# Patient Record
Sex: Male | Born: 2000 | Hispanic: No | Marital: Single | Smoking: Never smoker
Health system: Southern US, Community
[De-identification: ages and names within clinical notes are randomized; demographics above are authoritative.]

## PROBLEM LIST (undated history)

## (undated) DIAGNOSIS — G4700 Insomnia, unspecified: Secondary | ICD-10-CM

## (undated) DIAGNOSIS — G43909 Migraine, unspecified, not intractable, without status migrainosus: Secondary | ICD-10-CM

## (undated) DIAGNOSIS — E559 Vitamin D deficiency, unspecified: Secondary | ICD-10-CM

## (undated) DIAGNOSIS — F909 Attention-deficit hyperactivity disorder, unspecified type: Secondary | ICD-10-CM

## (undated) DIAGNOSIS — J45909 Unspecified asthma, uncomplicated: Secondary | ICD-10-CM

## (undated) DIAGNOSIS — F901 Attention-deficit hyperactivity disorder, predominantly hyperactive type: Secondary | ICD-10-CM

## (undated) DIAGNOSIS — R569 Unspecified convulsions: Secondary | ICD-10-CM

## (undated) DIAGNOSIS — M549 Dorsalgia, unspecified: Secondary | ICD-10-CM

## (undated) DIAGNOSIS — G40A09 Absence epileptic syndrome, not intractable, without status epilepticus: Secondary | ICD-10-CM

## (undated) DIAGNOSIS — K219 Gastro-esophageal reflux disease without esophagitis: Secondary | ICD-10-CM

## (undated) DIAGNOSIS — J309 Allergic rhinitis, unspecified: Secondary | ICD-10-CM

## (undated) DIAGNOSIS — G4730 Sleep apnea, unspecified: Secondary | ICD-10-CM

## (undated) DIAGNOSIS — T50905A Adverse effect of unspecified drugs, medicaments and biological substances, initial encounter: Secondary | ICD-10-CM

## (undated) HISTORY — PX: ADENOIDECTOMY: SUR15

## (undated) HISTORY — PX: OTHER SURGICAL HISTORY: SHX169

## (undated) HISTORY — PX: TONSILLECTOMY: SUR1361

## (undated) HISTORY — PX: TYMPANOSTOMY TUBE PLACEMENT: SHX32

---
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
## 2022-01-14 DIAGNOSIS — Z20828 Contact with and (suspected) exposure to other viral communicable diseases: Secondary | ICD-10-CM | POA: Diagnosis not present

## 2022-01-14 DIAGNOSIS — R030 Elevated blood-pressure reading, without diagnosis of hypertension: Secondary | ICD-10-CM | POA: Diagnosis not present

## 2022-01-14 DIAGNOSIS — H6691 Otitis media, unspecified, right ear: Secondary | ICD-10-CM | POA: Diagnosis not present

## 2022-01-14 DIAGNOSIS — J019 Acute sinusitis, unspecified: Secondary | ICD-10-CM | POA: Diagnosis not present

## 2022-01-17 DIAGNOSIS — J4520 Mild intermittent asthma, uncomplicated: Secondary | ICD-10-CM | POA: Diagnosis not present

## 2022-01-17 DIAGNOSIS — K219 Gastro-esophageal reflux disease without esophagitis: Secondary | ICD-10-CM | POA: Diagnosis not present

## 2022-01-17 DIAGNOSIS — R030 Elevated blood-pressure reading, without diagnosis of hypertension: Secondary | ICD-10-CM | POA: Diagnosis not present

## 2022-01-17 DIAGNOSIS — Z118 Encounter for screening for other infectious and parasitic diseases: Secondary | ICD-10-CM | POA: Diagnosis not present

## 2022-01-17 DIAGNOSIS — E559 Vitamin D deficiency, unspecified: Secondary | ICD-10-CM | POA: Diagnosis not present

## 2022-01-17 DIAGNOSIS — J309 Allergic rhinitis, unspecified: Secondary | ICD-10-CM | POA: Diagnosis not present

## 2022-01-17 DIAGNOSIS — F988 Other specified behavioral and emotional disorders with onset usually occurring in childhood and adolescence: Secondary | ICD-10-CM | POA: Diagnosis not present

## 2022-01-17 DIAGNOSIS — S61215A Laceration without foreign body of left ring finger without damage to nail, initial encounter: Secondary | ICD-10-CM | POA: Diagnosis not present

## 2022-01-17 DIAGNOSIS — Z68.41 Body mass index (BMI) pediatric, greater than or equal to 95th percentile for age: Secondary | ICD-10-CM | POA: Diagnosis not present

## 2022-01-17 DIAGNOSIS — Z0000 Encounter for general adult medical examination without abnormal findings: Secondary | ICD-10-CM | POA: Diagnosis not present

## 2022-01-24 DIAGNOSIS — Z68.41 Body mass index (BMI) pediatric, greater than or equal to 95th percentile for age: Secondary | ICD-10-CM | POA: Diagnosis not present

## 2022-01-24 DIAGNOSIS — J209 Acute bronchitis, unspecified: Secondary | ICD-10-CM | POA: Diagnosis not present

## 2022-04-18 DIAGNOSIS — R030 Elevated blood-pressure reading, without diagnosis of hypertension: Secondary | ICD-10-CM | POA: Diagnosis not present

## 2022-04-18 DIAGNOSIS — E559 Vitamin D deficiency, unspecified: Secondary | ICD-10-CM | POA: Diagnosis not present

## 2022-04-18 DIAGNOSIS — J4520 Mild intermittent asthma, uncomplicated: Secondary | ICD-10-CM | POA: Diagnosis not present

## 2022-04-18 DIAGNOSIS — F988 Other specified behavioral and emotional disorders with onset usually occurring in childhood and adolescence: Secondary | ICD-10-CM | POA: Diagnosis not present

## 2022-04-18 DIAGNOSIS — K219 Gastro-esophageal reflux disease without esophagitis: Secondary | ICD-10-CM | POA: Diagnosis not present

## 2022-04-18 DIAGNOSIS — J309 Allergic rhinitis, unspecified: Secondary | ICD-10-CM | POA: Diagnosis not present

## 2022-04-18 DIAGNOSIS — Z68.41 Body mass index (BMI) pediatric, greater than or equal to 95th percentile for age: Secondary | ICD-10-CM | POA: Diagnosis not present

## 2022-06-13 ENCOUNTER — Ambulatory Visit: Payer: BC Managed Care – PPO | Admitting: "Endocrinology

## 2022-06-13 ENCOUNTER — Telehealth: Payer: Self-pay | Admitting: "Endocrinology

## 2022-06-13 DIAGNOSIS — E049 Nontoxic goiter, unspecified: Secondary | ICD-10-CM

## 2022-06-13 NOTE — Telephone Encounter (Signed)
Labs were updated. 

## 2022-06-13 NOTE — Telephone Encounter (Signed)
Can you update labs 

## 2022-06-13 NOTE — Telephone Encounter (Signed)
Labs were updated.

## 2022-07-08 IMAGING — DX DG FINGER LITTLE 2+V*R*
3 series · 3 of 3 positions shown · non-contrast
Comparison: None.

CLINICAL DATA: Recent trip and fall with fifth digit pain, initial
encounter

EXAM:
RIGHT LITTLE FINGER 2+V

[finger ap]
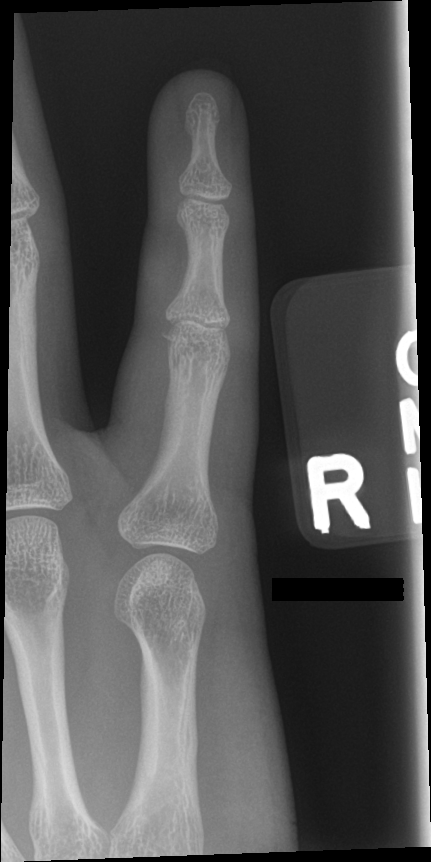

[finger obl]
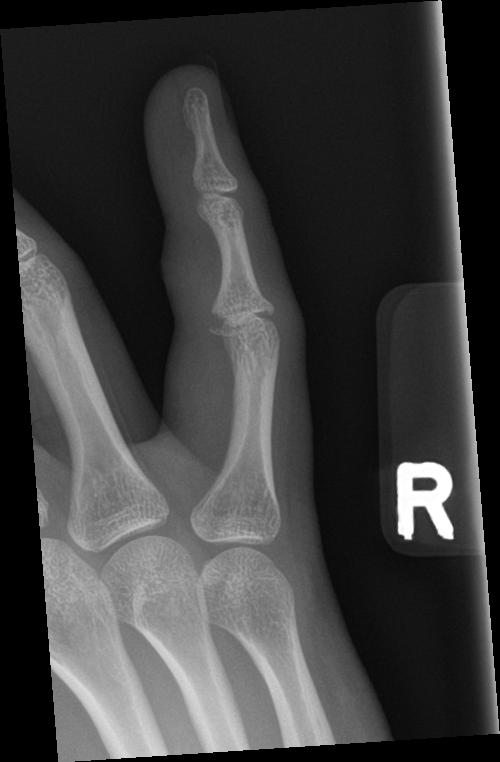

[finger lat]
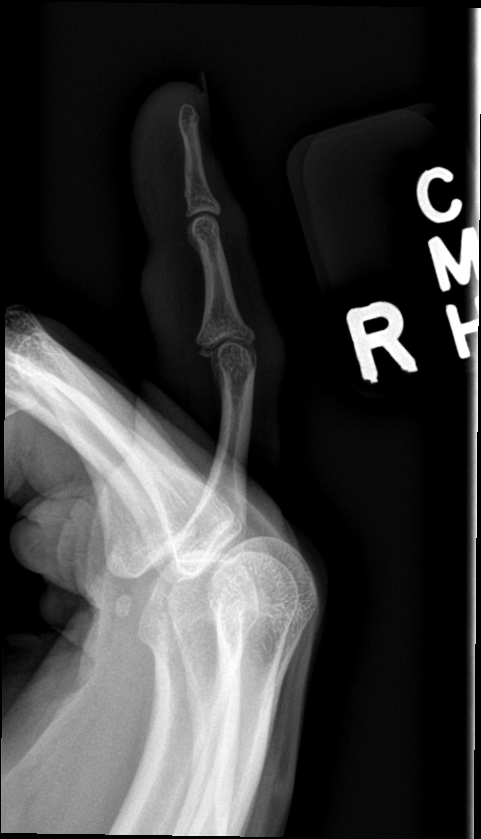

[3 of 3 positions shown; findings below may reference images not displayed]

FINDINGS: There is a fracture at the lateral base of the fifth middle phalanx
with mild displacement of the fracture fragment. No other fracture
is seen. Mild soft tissue swelling is noted.
IMPRESSION: Fracture at the base of the fifth middle phalanx.

## 2022-11-26 ENCOUNTER — Encounter (INDEPENDENT_AMBULATORY_CARE_PROVIDER_SITE_OTHER): Payer: Self-pay

## 2022-11-26 ENCOUNTER — Ambulatory Visit (INDEPENDENT_AMBULATORY_CARE_PROVIDER_SITE_OTHER): Payer: BC Managed Care – PPO | Admitting: Otolaryngology

## 2022-11-26 ENCOUNTER — Encounter (INDEPENDENT_AMBULATORY_CARE_PROVIDER_SITE_OTHER): Payer: Self-pay | Admitting: Otolaryngology

## 2022-11-26 VITALS — Ht 69.0 in | Wt 225.0 lb

## 2022-11-26 DIAGNOSIS — R040 Epistaxis: Secondary | ICD-10-CM

## 2022-11-26 DIAGNOSIS — J342 Deviated nasal septum: Secondary | ICD-10-CM

## 2022-11-26 DIAGNOSIS — J343 Hypertrophy of nasal turbinates: Secondary | ICD-10-CM | POA: Diagnosis not present

## 2022-11-26 DIAGNOSIS — J310 Chronic rhinitis: Secondary | ICD-10-CM | POA: Diagnosis not present

## 2022-11-28 DIAGNOSIS — J343 Hypertrophy of nasal turbinates: Secondary | ICD-10-CM | POA: Insufficient documentation

## 2022-11-28 DIAGNOSIS — R040 Epistaxis: Secondary | ICD-10-CM | POA: Insufficient documentation

## 2022-11-28 DIAGNOSIS — J342 Deviated nasal septum: Secondary | ICD-10-CM | POA: Insufficient documentation

## 2022-11-28 DIAGNOSIS — J310 Chronic rhinitis: Secondary | ICD-10-CM | POA: Insufficient documentation

## 2022-11-28 NOTE — Progress Notes (Unsigned)
Patient ID: Wesley Holland, male   DOB: 2000/06/29, 22 y.o.   MRN: [id number]  Follow-up: Chronic nasal congestion, nasal septal deviation New complaint: Recurrent epistaxis  HPI: The patient is a 22 year old male who presents today with a new complaint of recurrent right epistaxis.  The patient was previously seen for chronic nasal congestion.  He was previously noted to have nasal mucosal congestion, nasal septal deviation, and bilateral inferior turbinate hypertrophy.  The patient was treated with Flonase nasal spray.  The patient returns today complaining of occasional nasal congestion.  He has stopped the use of Flonase due to his recurrent epistaxis.  5 days ago, he had an episode of severe right epistaxis.  He was seen at the emergency room, and his right nasal cavity was packed with a Rhino Rocket packing.  The patient has a history of recurrent epistaxis.  He was previously cauterized on multiple occasions.  He is not on any anticoagulation medication.  He denies any recent nasal trauma.  Exam: General: Communicates without difficulty, well nourished, no acute distress. Head: Normocephalic, no evidence injury, no tenderness, facial buttresses intact without stepoff. Face/sinus: No tenderness to palpation and percussion. Facial movement is normal and symmetric. Eyes: PERRL, EOMI. No scleral icterus, conjunctivae clear. Neuro: CN II exam reveals vision grossly intact.  No nystagmus at any point of gaze. Ears: Auricles well formed without lesions.  Ear canals are intact without mass or lesion.  No erythema or edema is appreciated.  The TMs are intact without fluid. Nose: External evaluation reveals normal support and skin without lesions.  Dorsum is intact.  The right nasal packing is removed.  Hypervascular areas are noted on the right anterior nasal septum.  Deviated nasal septum is noted.  Oral:  Oral cavity and oropharynx are intact, symmetric, without erythema or edema.  Mucosa is moist without  lesions. Neck: Full range of motion without pain.  There is no significant lymphadenopathy.  No masses palpable.  Thyroid bed within normal limits to palpation.  Parotid glands and submandibular glands equal bilaterally without mass.  Trachea is midline. Neuro:  CN 2-12 grossly intact.    Procedure:  Cauterization of the right anterior nasal septum.   Indication: To control the recurrent epistaxis.   Description:  The nasal septum is sprayed with topical xylocaine and neo-synephrine. After adequate anesthesia is achieved, the anterior nasal septum is extensively cauterized with silver nitrate. Bleeding is noted and controlled. Multiple passes are made. Topical ointment is applied to the nasal septum. The patient tolerated the procedure well without difficulty.   Assessment: 1.  Recurrent right epistaxis.  Hypervascular areas are noted on the right anterior nasal septum. 2.  Chronic rhinitis with nasal mucosal congestion, nasal septal deviation, and bilateral inferior turbinate hypertrophy.  Plan: 1.  The physical exam findings are reviewed with the patient. 2.  Extensive cauterization of the right anterior nasal septum. 3.  The patient may use Flonase as needed to treat his chronic nasal congestion.  The proper technique of using the nasal spray is demonstrated to the patient. 4.  The patient will return for reevaluation in 1 month. 5.  The option of septoplasty and turbinate reduction to treat his chronic nasal obstruction is also discussed.

## 2022-12-29 ENCOUNTER — Encounter (INDEPENDENT_AMBULATORY_CARE_PROVIDER_SITE_OTHER): Payer: Self-pay | Admitting: Otolaryngology

## 2022-12-29 ENCOUNTER — Ambulatory Visit (INDEPENDENT_AMBULATORY_CARE_PROVIDER_SITE_OTHER): Payer: BC Managed Care – PPO | Admitting: Otolaryngology

## 2022-12-29 ENCOUNTER — Encounter (INDEPENDENT_AMBULATORY_CARE_PROVIDER_SITE_OTHER): Payer: Self-pay

## 2022-12-29 VITALS — Ht 69.0 in | Wt 225.0 lb

## 2022-12-29 DIAGNOSIS — J343 Hypertrophy of nasal turbinates: Secondary | ICD-10-CM

## 2022-12-29 DIAGNOSIS — J342 Deviated nasal septum: Secondary | ICD-10-CM | POA: Diagnosis not present

## 2022-12-29 DIAGNOSIS — R0981 Nasal congestion: Secondary | ICD-10-CM | POA: Diagnosis not present

## 2022-12-29 DIAGNOSIS — R040 Epistaxis: Secondary | ICD-10-CM

## 2022-12-29 DIAGNOSIS — J310 Chronic rhinitis: Secondary | ICD-10-CM | POA: Diagnosis not present

## 2022-12-30 NOTE — Progress Notes (Signed)
Patient ID: Wesley Holland, male   DOB: 12-05-2000, 22 y.o.   MRN: [id number]  Follow-up: Chronic nasal obstruction, recurrent epistaxis  HPI: The patient is a 22 year old male who returns today for his follow-up evaluation.  The patient has a history of chronic nasal obstruction.  He was previously noted to have nasal mucosal congestion, nasal septal deviation, and bilateral inferior turbinate hypertrophy.  He was treated with Flonase nasal spray daily.  At his last visit 1 month ago, he was also noted to have recurrent right epistaxis.  Hypervascular areas were noted on the right anterior nasal septum.  He underwent cauterization of the right nasal septum.  The patient returns today reporting no more nasal bleeding.  However, he continues to have significant difficulty breathing through his nose.  He has not noted any improvement in his breathing with the medical treatment.  He has been using steroid nasal spray for the past 3 years.  Exam: General: Communicates without difficulty, well nourished, no acute distress. Head: Normocephalic, no evidence injury, no tenderness, facial buttresses intact without stepoff. Face/sinus: No tenderness to palpation and percussion. Facial movement is normal and symmetric. Eyes: PERRL, EOMI. No scleral icterus, conjunctivae clear. Neuro: CN II exam reveals vision grossly intact.  No nystagmus at any point of gaze. Ears: Auricles well formed without lesions.  Ear canals are intact without mass or lesion.  No erythema or edema is appreciated.  The TMs are intact without fluid. Nose: External evaluation reveals normal support and skin without lesions.  Dorsum is intact.  Anterior rhinoscopy reveals congested mucosa over anterior aspect of inferior turbinates and deviated septum.  No purulence noted. Oral:  Oral cavity and oropharynx are intact, symmetric, without erythema or edema.  Mucosa is moist without lesions. Neck: Full range of motion without pain.  There is no significant  lymphadenopathy.  No masses palpable.  Thyroid bed within normal limits to palpation.  Parotid glands and submandibular glands equal bilaterally without mass.  Trachea is midline. Neuro:  CN 2-12 grossly intact.    Assessment: 1.  Chronic rhinitis with nasal mucosal congestion, nasal septal deviation, and bilateral inferior turbinate hypertrophy.  More than 95% of his nasal passageways are obstructed bilaterally.  He has not responded to medical treatment for the past 3+ years. 2.  No more recurrent epistaxis, status post cauterization of the right nasal septum.  Plan: 1.  The physical exam findings are reviewed with the patient. 2.  Continue with Flonase nasal spray 2 sprays each nostril daily. 3.  In light of his persistent symptoms, he may benefit from surgical intervention with septoplasty and bilateral turbinate reduction.  The risk, benefits, alternatives, and details of the procedures are extensively discussed.  Questions are invited and answered. 4.  The patient would like to proceed with the procedures.

## 2023-07-09 ENCOUNTER — Telehealth: Payer: Self-pay | Admitting: "Endocrinology

## 2023-07-09 ENCOUNTER — Other Ambulatory Visit: Payer: Self-pay | Admitting: *Deleted

## 2023-07-09 DIAGNOSIS — E049 Nontoxic goiter, unspecified: Secondary | ICD-10-CM

## 2023-07-09 NOTE — Telephone Encounter (Signed)
 Patient labs have been updated.

## 2023-07-09 NOTE — Telephone Encounter (Signed)
 Can you update pt's labs? His pcp sent over his Ultrasound and they want him to be seen.   Thank you

## 2023-07-18 LAB — THYROGLOBULIN ANTIBODY: Thyroglobulin Antibody: <1 [IU]/mL (ref 0.0–0.9)

## 2023-07-18 LAB — T4, FREE: Free T4: 1 ng/dL (ref 0.82–1.77)

## 2023-07-18 LAB — THYROID PEROXIDASE ANTIBODY: Thyroperoxidase Ab SerPl-aCnc: 16 [IU]/mL (ref 0–34)

## 2023-07-18 LAB — TSH: TSH: 1.5 u[IU]/mL (ref 0.450–4.500)

## 2023-07-23 ENCOUNTER — Ambulatory Visit: Admitting: "Endocrinology

## 2023-07-23 ENCOUNTER — Encounter: Payer: Self-pay | Admitting: "Endocrinology

## 2023-07-23 VITALS — BP 104/68 | HR 68 | Ht 69.0 in | Wt 231.2 lb

## 2023-07-23 DIAGNOSIS — E049 Nontoxic goiter, unspecified: Secondary | ICD-10-CM

## 2023-07-23 NOTE — Progress Notes (Signed)
 Endocrinology follow-up note                                             07/23/2023, 3:13 PM   Subjective:    Patient ID: Wesley Holland, male    DOB: Jul 15, 2000, PCP Wesley Cure, PA-C   Past Medical History:  Diagnosis Date   ADHD (attention deficit hyperactivity disorder), predominantly hyperactive impulsive type 10/01/2018   Adverse drug interaction with prescription medication 06/02/2011   Hospitalized due to interaction between Zarontin and Morphine given in ED for sedation for laceration repair   Allergic rhinitis due to allergen 06/05/2008   Asthma 2007   Back pain 10/07/2017   Episodic tension-type headache, not intractable 07/25/2016   Gastroesophageal Reflux 12/30/2013   Gastroesophageal reflux disease 12/30/2013   Insomnia 05/22/2017   Migraine 10/30/2014   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   Seizures (HCC)    febrile   Sleep apnea 12/28/2006   [facility]   Vitamin D  deficiency 08/23/2018   Past Surgical History:  Procedure Laterality Date   TONSILLECTOMY AND ADENOIDECTOMY Bilateral 2007   TYMPANOSTOMY TUBE PLACEMENT  2005   WISDOM TOOTH EXTRACTION  09/06/2019   Social History   Socioeconomic History   Marital status: Single    Spouse name: Not on file   Number of children: Not on file   Years of education: Not on file   Highest education level: Not on file  Occupational History   Not on file  Tobacco Use   Smoking status: Never    Passive exposure: Yes   Smokeless tobacco: Never  Vaping Use   Vaping status: Never Used  Substance and Sexual Activity   Alcohol use: Never   Drug use: Never   Sexual activity: Never  Other Topics Concern   Not on file  Social History Narrative   Wesley Holland is a 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 He lives with his mother. He enjoys video games, YouTube, and being with his friends. He does not like playing outside in the summer because it induces  migraines.   Social Drivers of Corporate investment banker Strain: Not on file  Food Insecurity: Not on file  Transportation Needs: Not on file  Physical Activity: Not on file  Stress: Not on file  Social Connections: Not on file   Family History  Problem Relation Age of Onset   Cancer Mother    Hypertension Mother    Diabetes Mother    Hyperlipidemia Mother    Outpatient Encounter Medications as of 07/23/2023  Medication Sig   montelukast  (SINGULAIR ) 10 MG tablet Take 1 tablet (10 mg total) by mouth daily.   albuterol  (VENTOLIN  HFA) 108 (90 Base) MCG/ACT inhaler Inhale 2 puffs into the lungs every 4 (four) hours as needed for wheezing or shortness of breath.   cetirizine  (ZYRTEC ) 10 MG tablet Take 1 tablet (10 mg total) by mouth daily.   cloNIDine  (CATAPRES ) 0.1 MG tablet Take 1 tablet (0.1 mg total) by mouth at bedtime as needed (insomnia). (Patient not taking: Reported on 07/12/2021)   fluticasone  (FLONASE ) 50 MCG/ACT nasal spray Place 1 spray into both nostrils daily.   lansoprazole  (PREVACID ) 15 MG capsule Take 1 capsule (15 mg total) by mouth daily at 12 noon.   lisdexamfetamine (VYVANSE ) 20 MG  capsule Take 1 capsule (20 mg total) by mouth daily in the afternoon.   lisdexamfetamine (VYVANSE ) 20 MG capsule Take 1 capsule (20 mg total) by mouth daily in the afternoon.   lisdexamfetamine (VYVANSE ) 20 MG capsule Take 1 capsule (20 mg total) by mouth daily in the afternoon.   lisdexamfetamine (VYVANSE ) 20 MG capsule Take 1 capsule (20 mg total) by mouth daily in the afternoon.   lisdexamfetamine (VYVANSE ) 40 MG capsule Take 1 capsule (40 mg total) by mouth every morning.   lisdexamfetamine (VYVANSE ) 40 MG capsule Take 1 capsule (40 mg total) by mouth every morning.   lisdexamfetamine (VYVANSE ) 40 MG capsule Take 1 capsule (40 mg total) by mouth every morning.   lisdexamfetamine (VYVANSE ) 40 MG capsule Take 1 capsule (40 mg total) by mouth every morning.   polyethylene glycol powder  (GLYCOLAX /MIRALAX ) 17 GM/SCOOP powder Take 17 g by mouth daily.   Spacer/Aero-Holding Chambers (AEROCHAMBER PLUS) inhaler Use as instructed   Vitamin D , Ergocalciferol , (DRISDOL ) 1.25 MG (50000 UNIT) CAPS capsule Take 50,000 Units by mouth once a week. (Patient not taking: Reported on 07/23/2023)   No facility-administered encounter medications on file as of 07/23/2023.   ALLERGIES: Allergies  Allergen Reactions   Other Other (See Comments)    Seizure Seizure   Morphine And Codeine Nausea And Vomiting   Pseudoephedrine  Hcl Other (See Comments)    Seizures came on when on this med Seizures came on when on this med   Morphine Nausea And Vomiting    VACCINATION STATUS: Immunization History  Administered Date(s) Administered   DTaP 06/20/2002, 08/31/2002, 11/03/2002, 12/04/2003, 06/17/2007   HIB (PRP-OMP) 06/20/2002, 08/31/2002, 11/03/2002, 12/04/2003   Hepatitis A 05/08/2005, 11/27/2005   Hepatitis B 11/16/02, 06/20/2002, 11/03/2002, 01/25/2003   Hpv-Unspecified 07/08/2012, 09/02/2012, 02/25/2013   IPV 06/20/2002, 08/31/2002, 12/04/2003, 06/17/2007   Influenza,inj,Quad PF,6+ Mos 12/06/2018   Influenza-Unspecified 12/06/2018   MMR 05/17/2003, 06/17/2007   Meningococcal B, OMV 04/26/2018, 05/24/2018   Meningococcal Mcv4o 09/12/2013, 04/26/2018   PFIZER(Purple Top)SARS-COV-2 Vaccination 08/02/2019, 08/31/2019   Pneumococcal Conjugate-13 06/20/2002, 08/31/2002, 11/03/2002   Tdap 09/12/2013, 12/12/2021   Varicella 05/17/2003, 06/17/2007    HPI Wesley Holland is 23 y.o. male who presents today with a medical history as above. he is being seen in follow-up after he was seen in consultation for nodular goiter requested by Wesley Cure, PA-C.   He is accompanied by his mother to the clinic.  He denies any prior history of thyroid  dysfunction. He was diagnosed with nodular goiter in May 2023.  He was advised to return in 6 months after his last visit in June 2023, however patient did  not show up for follow-up.  He underwent repeat thyroid  ultrasound on 10/07/2023 showing slight increase in the size of the left-sided nodule to 1 cm from 0.5 cm and now achieving TR 4 features. He denies neck pain, denies dysphagia nor voice change. His previsit thyroid  function did not consistent with euthyroid state.  Patient has family history of unidentified thyroid  dysfunctions in multiple family members including a grandparent.  Not clear family history of thyroid  malignancy.  Patient does not have any exposure to neck radiation. He is not a smoker. Patient has airway disease on inhalers including albuterol , and Singulair . -No major recent weight change.  Review of Systems  Constitutional: no recent weight gain/loss, no fatigue, no subjective hyperthermia, no subjective hypothermia Eyes: no blurry vision, no xerophthalmia ENT: no sore throat, no nodules palpated in throat, no dysphagia/odynophagia, no hoarseness   Objective:  07/23/2023    1:52 PM 12/29/2022    3:10 PM 11/26/2022    1:01 PM  Vitals with BMI  Height 5' 9 5' 9 5' 9  Weight 231 lbs 3 oz 225 lbs 225 lbs  BMI 34.13 33.21 33.21  Systolic 104    Diastolic 68    Pulse 68      BP 104/68   Pulse 68   Ht 5' 9 (1.753 m)   Wt 231 lb 3.2 oz (104.9 kg)   BMI 34.14 kg/m   Wt Readings from Last 3 Encounters:  07/23/23 231 lb 3.2 oz (104.9 kg)  12/29/22 225 lb (102.1 kg)  11/26/22 225 lb (102.1 kg)    Physical Exam  Constitutional:  Body mass index is 34.14 kg/m.,  not in acute distress, normal state of mind Eyes: PERRLA, EOMI, no exophthalmos ENT: moist mucous membranes, + gross thyromegaly, no gross cervical lymphadenopathy     Diabetic Labs (most recent): Lab Results  Component Value Date   HGBA1C 5.5 10/12/2019     Lipid Panel ( most recent) Lipid Panel     Component Value Date/Time   CHOL 162 10/12/2019 1553   TRIG 67 10/12/2019 1553   HDL 34 (L) 10/12/2019 1553   CHOLHDL 4.8  10/12/2019 1553   LDLCALC 115 (H) 10/12/2019 1553   LABVLDL 13 10/12/2019 1553     Recent Results (from the past 2160 hours)  TSH     Status: None   Collection Time: 07/17/23  3:49 PM  Result Value Ref Range   TSH 1.500 0.450 - 4.500 uIU/mL  T4, Free     Status: None   Collection Time: 07/17/23  3:49 PM  Result Value Ref Range   Free T4 1.00 0.82 - 1.77 ng/dL  Thyroid  peroxidase antibody     Status: None   Collection Time: 07/17/23  3:49 PM  Result Value Ref Range   Thyroperoxidase Ab SerPl-aCnc 16 0 - 34 IU/mL  Thyroglobulin antibody     Status: None   Collection Time: 07/17/23  3:49 PM  Result Value Ref Range   Thyroglobulin Antibody <1.0 0.0 - 0.9 IU/mL    Comment: Thyroglobulin Antibody measured by [employer] Coulter Methodology It should be noted that the presence of thyroglobulin antibodies may not be pathogenic nor diagnostic, especially at very low levels. The assay manufacturer has found that four percent of individuals without evidence of thyroid  disease or autoimmunity will have positive TgAb levels up to 4 IU/mL.     10/07/2023 thyroid  ultrasound: Right lobe 5.2 x 1.3 x 1.8 cm.  No discrete nodules Left lobe 5.0 x 1.1 x 2.0 cm with 1.0 x 0.7 x 0.6 cm nodule TR 4.   Assessment & Plan:   1. Nodular goiter   - I have reviewed his new and available thyroid  records and clinically evaluated the patient. - Based on these reviews, he has nodular goiter. Based on size and description of the 1.0 cm left lobe nodule growing from0.5 cm left lobe nodule, he will not need surgical intervention nor fine-needle biopsy at this time.  This is considered slow growth over 2 years.  However he will need repeat ultrasound in 6 months with office visit.     In light of his recent euthyroid thyroid  function test, he will not need thyroid  hormone supplement or antithyroid intervention at this time.   - he is advised to maintain close follow up with Wesley Cure, PA-C for primary care  needs.  I spent  21  minutes in the care of the patient today including review of labs from Thyroid  Function, CMP, and other relevant labs ; imaging/biopsy records (current and previous including abstractions from other facilities); face-to-face time discussing  his lab results and symptoms, medications doses, his options of short and long term treatment based on the latest standards of care / guidelines;   and documenting the encounter.  Elyas Jaspers  participated in the discussions, expressed understanding, and voiced agreement with the above plans.  All questions were answered to his satisfaction. he is encouraged to contact clinic should he have any questions or concerns prior to his return visit.   Follow up plan: Return in about 6 months (around 01/22/2024), or At [facility] at [facility] Office, for Thyroid  / Neck Ultrasound.   Kalvin Orf, MD [facility] [address] [place], [place] [address] Phone: [phone]  Fax: [phone]     07/23/2023, 3:13 PM  This note was partially dictated with voice recognition software. Similar sounding words can be transcribed inadequately or may not  be corrected upon review.

## 2024-01-25 ENCOUNTER — Encounter: Payer: Self-pay | Admitting: "Endocrinology

## 2024-01-25 ENCOUNTER — Ambulatory Visit: Admitting: "Endocrinology

## 2024-01-25 VITALS — BP 104/80 | Ht 69.0 in | Wt 229.0 lb

## 2024-01-25 DIAGNOSIS — E049 Nontoxic goiter, unspecified: Secondary | ICD-10-CM

## 2024-01-25 NOTE — Progress Notes (Signed)
 Endocrinology follow-up note                                             01/25/2024, 5:13 PM   Subjective:    Patient ID: Wesley Holland, male    DOB: May 26, 2000, PCP Vida Mardy DEL, PA-C   Past Medical History:  Diagnosis Date   ADHD (attention deficit hyperactivity disorder), predominantly hyperactive impulsive type 10/01/2018   Adverse drug interaction with prescription medication 06/02/2011   Hospitalized due to interaction between Zarontin and Morphine given in ED for sedation for laceration repair   Allergic rhinitis due to allergen 06/05/2008   Asthma 2007   Back pain 10/07/2017   Episodic tension-type headache, not intractable 07/25/2016   Gastroesophageal Reflux 12/30/2013   Gastroesophageal reflux disease 12/30/2013   Insomnia 05/22/2017   Migraine 10/30/2014   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   Seizures (HCC)    febrile   Sleep apnea 12/28/2006   [facility]   Vitamin D  deficiency 08/23/2018   Past Surgical History:  Procedure Laterality Date   TONSILLECTOMY AND ADENOIDECTOMY Bilateral 2007   TYMPANOSTOMY TUBE PLACEMENT  2005   WISDOM TOOTH EXTRACTION  09/06/2019   Social History   Socioeconomic History   Marital status: Single    Spouse name: Not on file   Number of children: Not on file   Years of education: Not on file   Highest education level: Not on file  Occupational History   Not on file  Tobacco Use   Smoking status: Never    Passive exposure: Yes   Smokeless tobacco: Never  Vaping Use   Vaping status: Never Used  Substance and Sexual Activity   Alcohol use: Never   Drug use: Never   Sexual activity: Never  Other Topics Concern   Not on file  Social History Narrative   Wesley Holland is a 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 He lives with his mother. He enjoys video games, YouTube, and being with his friends. He does not like playing outside in the summer because it induces  migraines.   Social Drivers of Health   Tobacco Use: Medium Risk (01/25/2024)   Patient History    Smoking Tobacco Use: Never    Smokeless Tobacco Use: Never    Passive Exposure: Yes  Financial Resource Strain: Not on file  Food Insecurity: Not on file  Transportation Needs: Not on file  Physical Activity: Not on file  Stress: Not on file  Social Connections: Not on file  Depression (EYV7-0): Not on file  Alcohol Screen: Not on file  Housing: Not on file  Utilities: Not on file  Health Literacy: Not on file   Family History  Problem Relation Age of Onset   Cancer Mother    Hypertension Mother    Diabetes Mother    Hyperlipidemia Mother    Outpatient Encounter Medications as of 01/25/2024  Medication Sig   albuterol  (VENTOLIN  HFA) 108 (90 Base) MCG/ACT inhaler Inhale 2 puffs into the lungs every 4 (four) hours as needed for wheezing or shortness of breath.   cetirizine  (ZYRTEC ) 10 MG tablet Take 1 tablet (10 mg total) by mouth daily.   cloNIDine  (CATAPRES ) 0.1 MG tablet Take 1 tablet (0.1 mg total) by mouth at bedtime as needed (insomnia).   fluticasone  (FLONASE )  50 MCG/ACT nasal spray Place 1 spray into both nostrils daily.   lansoprazole  (PREVACID ) 15 MG capsule Take 1 capsule (15 mg total) by mouth daily at 12 noon.   lisdexamfetamine  (VYVANSE ) 20 MG capsule Take 1 capsule (20 mg total) by mouth daily in the afternoon.   lisdexamfetamine  (VYVANSE ) 20 MG capsule Take 1 capsule (20 mg total) by mouth daily in the afternoon.   lisdexamfetamine  (VYVANSE ) 20 MG capsule Take 1 capsule (20 mg total) by mouth daily in the afternoon.   lisdexamfetamine  (VYVANSE ) 20 MG capsule Take 1 capsule (20 mg total) by mouth daily in the afternoon.   lisdexamfetamine  (VYVANSE ) 40 MG capsule Take 1 capsule (40 mg total) by mouth every morning.   lisdexamfetamine  (VYVANSE ) 40 MG capsule Take 1 capsule (40 mg total) by mouth every morning.   lisdexamfetamine  (VYVANSE ) 40 MG capsule Take 1 capsule  (40 mg total) by mouth every morning.   lisdexamfetamine  (VYVANSE ) 40 MG capsule Take 1 capsule (40 mg total) by mouth every morning.   montelukast  (SINGULAIR ) 10 MG tablet Take 1 tablet (10 mg total) by mouth daily.   polyethylene glycol powder (GLYCOLAX /MIRALAX ) 17 GM/SCOOP powder Take 17 g by mouth daily.   Spacer/Aero-Holding Chambers (AEROCHAMBER PLUS) inhaler Use as instructed   Vitamin D , Ergocalciferol , (DRISDOL ) 1.25 MG (50000 UNIT) CAPS capsule Take 50,000 Units by mouth once a week.   No facility-administered encounter medications on file as of 01/25/2024.   ALLERGIES: Allergies  Allergen Reactions   Other Other (See Comments)    Seizure Seizure   Morphine And Codeine Nausea And Vomiting   Pseudoephedrine  Hcl Other (See Comments)    Seizures came on when on this med Seizures came on when on this med   Morphine Nausea And Vomiting    VACCINATION STATUS: Immunization History  Administered Date(s) Administered   DTaP 06/20/2002, 08/31/2002, 11/03/2002, 12/04/2003, 06/17/2007   HIB (PRP-OMP) 06/20/2002, 08/31/2002, 11/03/2002, 12/04/2003   Hepatitis A 05/08/2005, 11/27/2005   Hepatitis B 2003/01/28, 06/20/2002, 11/03/2002, 01/25/2003   Hpv-Unspecified 07/08/2012, 09/02/2012, 02/25/2013   IPV 06/20/2002, 08/31/2002, 12/04/2003, 06/17/2007   Influenza,inj,Quad PF,6+ Mos 12/06/2018   Influenza-Unspecified 12/06/2018   MMR 05/17/2003, 06/17/2007   Meningococcal B, OMV 04/26/2018, 05/24/2018   Meningococcal Mcv4o 09/12/2013, 04/26/2018   PFIZER(Purple Top)SARS-COV-2 Vaccination 08/02/2019, 08/31/2019   Pneumococcal Conjugate-13 06/20/2002, 08/31/2002, 11/03/2002   Tdap 09/12/2013, 12/12/2021   Varicella 05/17/2003, 06/17/2007    HPI Wesley Holland is 23 y.o. male who presents today with a medical history as above. he is being seen in follow-up after he was seen in consultation for nodular goiter requested by Vida Mardy DEL, PA-C.   He is accompanied by his mother to the  clinic.  He denies any prior history of thyroid  dysfunction. He was diagnosed with nodular goiter in May 2023.   He underwent repeat thyroid  ultrasound on 10/07/2023 showing slight increase in the size of the left-sided nodule to 1 cm from 0.5 cm and now achieving TR 4 features. Most recent thyroid  ultrasound in January 04, 2024 showed further increase in the size of the nodule at 1.2 cm, hypoechoic TR 4 nodule. He denies neck pain, denies dysphagia nor voice change.  Patient has family history of unidentified thyroid  dysfunctions in multiple family members including a grandparent.  Not clear family history of thyroid  malignancy.  Patient does not have any exposure to neck radiation. He is not a smoker. Patient has airway disease on inhalers including albuterol , and Singulair . -No major recent weight change.  Review of  Systems  Constitutional: no recent weight gain/loss, no fatigue, no subjective hyperthermia, no subjective hypothermia Eyes: no blurry vision, no xerophthalmia ENT: no sore throat, no nodules palpated in throat, no dysphagia/odynophagia, no hoarseness   Objective:       01/25/2024    4:04 PM 07/23/2023    1:52 PM 12/29/2022    3:10 PM  Vitals with BMI  Height 5' 9 5' 9 5' 9  Weight 229 lbs 231 lbs 3 oz 225 lbs  BMI 33.8 34.13 33.21  Systolic 104 104   Diastolic 80 68   Pulse  68     BP 104/80   Ht 5' 9 (1.753 m)   Wt 229 lb (103.9 kg)   BMI 33.82 kg/m   Wt Readings from Last 3 Encounters:  01/25/24 229 lb (103.9 kg)  07/23/23 231 lb 3.2 oz (104.9 kg)  12/29/22 225 lb (102.1 kg)    Physical Exam  Constitutional:  Body mass index is 33.82 kg/m.,  not in acute distress, normal state of mind Eyes: PERRLA, EOMI, no exophthalmos ENT: moist mucous membranes, + gross thyromegaly, no gross cervical lymphadenopathy     Diabetic Labs (most recent): Lab Results  Component Value Date   HGBA1C 5.5 10/12/2019     Lipid Panel ( most recent) Lipid  Panel     Component Value Date/Time   CHOL 162 10/12/2019 1553   TRIG 67 10/12/2019 1553   HDL 34 (L) 10/12/2019 1553   CHOLHDL 4.8 10/12/2019 1553   LDLCALC 115 (H) 10/12/2019 1553   LABVLDL 13 10/12/2019 1553     No results found for this or any previous visit (from the past 2160 hours).   10/07/2023 thyroid  ultrasound: Right lobe 5.2 x 1.3 x 1.8 cm.  No discrete nodules Left lobe 5.0 x 1.1 x 2.0 cm with 1.0 x 0.7 x 0.6 cm nodule TR 4.  January 04, 2024 thyroid  ultrasound: Right lobe 4.9 cm, no nodules.  Left lobe 5.1 cm with 1.2 cm nodule TR 4.  2.6 cm left cervical lymph node with benign features.   Assessment & Plan:   1. Nodular goiter  2.  Cervical lymphadenopathy   - I have reviewed his new and available thyroid  records and clinically evaluated the patient. - Based on these reviews, he has nodular goiter. Based on size and description of the 1.2 cm left lobe nodule growing from 0.5 cm left lobe nodule. A nodule displays features not sufficient for tissue biopsy at this time.  He is offered surveillance thyroid /neck ultrasound in 6 months with thyroid  function tests.     In light of his recent euthyroid thyroid  function test, he will not need thyroid  hormone supplement or antithyroid intervention at this time.    - he is advised to maintain close follow up with Vida Mardy DEL, PA-C for primary care needs.   I spent  20  minutes in the care of the patient today including review of labs from Thyroid  Function, CMP, and other relevant labs ; imaging/biopsy records (current and previous including abstractions from other facilities); face-to-face time discussing  his lab results and symptoms, medications doses, his options of short and long term treatment based on the latest standards of care / guidelines;   and documenting the encounter.  Muneeb Stepka  participated in the discussions, expressed understanding, and voiced agreement with the above plans.  All questions were  answered to his satisfaction. he is encouraged to contact clinic should he have any questions or concerns  prior to his return visit.   Follow up plan: Return in about 6 months (around 07/25/2024) for F/U with Pre-visit Labs, Thyroid  / Neck Ultrasound.   Ranny Earl, MD [facility] [address] [place], [place] [address] Phone: [phone]  Fax: [phone]     01/25/2024, 5:13 PM  This note was partially dictated with voice recognition software. Similar sounding words can be transcribed inadequately or may not  be corrected upon review.

## 2024-05-31 ENCOUNTER — Ambulatory Visit: Admitting: "Endocrinology
